# Patient Record
Sex: Male | Born: 1975 | Race: White | Hispanic: No | Marital: Married | Smoking: Never smoker
Health system: Southern US, Community
[De-identification: ages and names within clinical notes are randomized; demographics above are authoritative.]

## PROBLEM LIST (undated history)

## (undated) DIAGNOSIS — G479 Sleep disorder, unspecified: Secondary | ICD-10-CM

## (undated) DIAGNOSIS — F419 Anxiety disorder, unspecified: Secondary | ICD-10-CM

## (undated) HISTORY — PX: APPENDECTOMY: SHX54

## (undated) HISTORY — PX: HERNIA REPAIR: SHX51

---

## 2008-01-18 ENCOUNTER — Ambulatory Visit: Payer: Self-pay | Admitting: Family Medicine

## 2011-02-23 ENCOUNTER — Ambulatory Visit: Payer: Self-pay

## 2011-11-22 ENCOUNTER — Ambulatory Visit: Payer: Self-pay

## 2012-03-25 ENCOUNTER — Ambulatory Visit: Payer: Self-pay | Admitting: Internal Medicine

## 2012-07-01 ENCOUNTER — Ambulatory Visit: Payer: Self-pay | Admitting: Family Medicine

## 2012-09-17 ENCOUNTER — Ambulatory Visit: Payer: Self-pay | Admitting: Family Medicine

## 2013-08-05 ENCOUNTER — Ambulatory Visit: Payer: Self-pay | Admitting: Otolaryngology

## 2013-12-15 ENCOUNTER — Ambulatory Visit: Payer: Self-pay | Admitting: Family Medicine

## 2014-06-13 ENCOUNTER — Ambulatory Visit: Payer: Self-pay | Admitting: Emergency Medicine

## 2014-06-23 ENCOUNTER — Ambulatory Visit: Payer: Self-pay | Admitting: Family Medicine

## 2014-09-25 ENCOUNTER — Ambulatory Visit
Admission: EM | Admit: 2014-09-25 | Discharge: 2014-09-25 | Disposition: A | Discharge status: Home / Self Care | Payer: BLUE CROSS/BLUE SHIELD | Attending: Family Medicine | Admitting: Family Medicine

## 2014-09-25 ENCOUNTER — Encounter: Payer: Self-pay | Admitting: Family Medicine

## 2014-09-25 DIAGNOSIS — J40 Bronchitis, not specified as acute or chronic: Secondary | ICD-10-CM | POA: Diagnosis not present

## 2014-09-25 DIAGNOSIS — J0100 Acute maxillary sinusitis, unspecified: Secondary | ICD-10-CM

## 2014-09-25 DIAGNOSIS — H65191 Other acute nonsuppurative otitis media, right ear: Secondary | ICD-10-CM | POA: Diagnosis not present

## 2014-09-25 MED ORDER — FEXOFENADINE-PSEUDOEPHED ER 180-240 MG PO TB24: 1.0000 | ORAL_TABLET | Freq: Every day | ORAL | Status: DC

## 2014-09-25 MED ORDER — HYDROCOD POLST-CPM POLST ER 10-8 MG/5ML PO SUER: 5.0000 mL | Freq: Two times a day (BID) | ORAL | Status: DC | PRN

## 2014-09-25 MED ORDER — AMOXICILLIN-POT CLAVULANATE 875-125 MG PO TABS: 1.0000 | ORAL_TABLET | Freq: Two times a day (BID) | ORAL | Status: DC

## 2014-09-25 NOTE — ED Provider Notes (Signed)
CSN: [id number]     Arrival date & time 09/25/14  [id number] History   First MD Initiated Contact with Patient 09/25/14 [id number]     Chief Complaint  Patient presents with  . Cough    x 4 days sinus pain/ pressure, right ear pain, sore throat, HA, sinus drainage, runny nose, dry cough but sometimes coughs up grayish phlegm    (Consider location/radiation/quality/duration/timing/severity/associated sxs/prior Treatment) Patient is a 39 y.o. male presenting with cough. The history is provided by the patient. No language interpreter was used.  Cough Cough characteristics:  Productive and barking Sputum characteristics:  Green Severity:  Moderate Duration:  4 days Timing:  Constant Progression:  Worsening Chronicity:  New Context: upper respiratory infection   Relieved by:  Nothing Worsened by:  Lying down Ineffective treatments:  Cough suppressants Associated symptoms: ear fullness, ear pain, myalgias, rhinorrhea, sinus congestion and wheezing   Associated symptoms: no chest pain, no chills, no diaphoresis, no fever, no headaches, no shortness of breath, no sore throat and no weight loss   Ear pain:    Location:  Right   Severity:  Moderate   Duration:  3 days   Timing:  Constant   Progression:  Worsening Rhinorrhea:    Quality:  Green   Severity:  Moderate   Duration:  4 days   Progression:  Worsening   History reviewed. No pertinent past medical history. No past surgical history on file. History reviewed. No pertinent family history. History  Substance Use Topics  . Smoking status: Not on file  . Smokeless tobacco: Not on file  . Alcohol Use: Not on file    Review of Systems  Constitutional: Negative for fever, chills, weight loss and diaphoresis.  HENT: Positive for ear pain and rhinorrhea. Negative for sore throat.   Respiratory: Positive for cough and wheezing. Negative for shortness of breath.   Cardiovascular: Negative for chest pain.  Musculoskeletal: Positive for  myalgias.  Neurological: Negative for headaches.  All other systems reviewed and are negative.   Allergies  Review of patient's allergies indicates no known allergies.  Home Medications   Prior to Admission medications   Medication Sig Start Date End Date Taking? Authorizing Provider  amoxicillin-clavulanate (AUGMENTIN) 875-125 MG per tablet Take 1 tablet by mouth 2 (two) times daily. 09/25/14   Hassan RowanEugene Carisa Backhaus, MD  chlorpheniramine-HYDROcodone [facility](TUSSIONEX PENNKINETIC ER) 10-8 MG/5ML SUER Take 5 mLs by mouth every 12 (twelve) hours as needed for cough. 09/25/14   Hassan RowanEugene Juventino Pavone, MD  fexofenadine-pseudoephedrine (ALLEGRA-D ALLERGY & CONGESTION) 180-240 MG per 24 hr tablet Take 1 tablet by mouth daily. 09/25/14   Hassan RowanEugene Tiburcio Linder, MD   BP 146/90 mmHg  Pulse 70  Temp(Src) 98.2 F (36.8 C) (Tympanic)  Resp 20  Ht 5\' 11"  (1.803 m)  Wt 201 lb (91.173 kg)  BMI 28.05 kg/m2  SpO2 99% Physical Exam  Constitutional: He is oriented to person, place, and time. He appears well-developed and well-nourished.  HENT:  Head: Normocephalic and atraumatic.  Right Ear: External ear and ear canal normal. No swelling or tenderness. Tympanic membrane is erythematous and bulging.  Left Ear: Hearing, tympanic membrane and external ear normal.  Nose: Mucosal edema, rhinorrhea and sinus tenderness present. Right sinus exhibits maxillary sinus tenderness. Left sinus exhibits maxillary sinus tenderness.  Eyes: Pupils are equal, round, and reactive to light.  Neck: Neck supple. No tracheal deviation present.  Cardiovascular: Normal rate.   Pulmonary/Chest: Breath sounds normal. No respiratory distress. He has no wheezes.  Musculoskeletal: Normal  range of motion.  Lymphadenopathy:    He has cervical adenopathy.  Neurological: He is alert and oriented to person, place, and time.  Skin: Skin is warm.    ED Course  Procedures (including critical care time) Labs Review Labs Reviewed - No data to display  Imaging Review No  results found.   MDM   1. Acute maxillary sinusitis, recurrence not specified   2. Bronchitis   3. Other acute nonsuppurative otitis media of right ear        Hassan Rowan, MD 09/25/14 [phone]

## 2014-09-25 NOTE — Discharge Instructions (Signed)
Otitis Media Otitis media is redness, soreness, and puffiness (swelling) in the space just behind your eardrum (middle ear). It may be caused by allergies or infection. It often happens along with a cold. HOME CARE  Take your medicine as told. Finish it even if you start to feel better.  Only take over-the-counter or prescription medicines for pain, discomfort, or fever as told by your doctor.  Follow up with your doctor as told. GET HELP IF:  You have otitis media only in one ear, or bleeding from your nose, or both.  You notice a lump on your neck.  You are not getting better in 3-5 days.  You feel worse instead of better. GET HELP RIGHT AWAY IF:   You have pain that is not helped with medicine.  You have puffiness, redness, or pain around your ear.  You get a stiff neck.  You cannot move part of your face (paralysis).  You notice that the bone behind your ear hurts when you touch it. MAKE SURE YOU:   Understand these instructions.  Will watch your condition.  Will get help right away if you are not doing well or get worse. Document Released: 09/26/2007 Document Revised: 04/14/2013 Document Reviewed: 11/04/2012 [facility] Patient Information 2015 [place], [place]. This information is not intended to replace advice given to you by your health care provider. Make sure you discuss any questions you have with your health care provider.  Sinusitis Sinusitis is redness, soreness, and puffiness (inflammation) of the air pockets in the bones of your face (sinuses). The redness, soreness, and puffiness can cause air and mucus to get trapped in your sinuses. This can allow germs to grow and cause an infection.  HOME CARE   Drink enough fluids to keep your pee (urine) clear or pale yellow.  Use a humidifier in your home.  Run a hot shower to create steam in the bathroom. Sit in the bathroom with the door closed. Breathe in the steam 3-4 times a day.  Put a warm, moist washcloth  on your face 3-4 times a day, or as told by your doctor.  Use salt water sprays (saline sprays) to wet the thick fluid in your nose. This can help the sinuses drain.  Only take medicine as told by your doctor. GET HELP RIGHT AWAY IF:   Your pain gets worse.  You have very bad headaches.  You are sick to your stomach (nauseous).  You throw up (vomit).  You are very sleepy (drowsy) all the time.  Your face is puffy (swollen).  Your vision changes.  You have a stiff neck.  You have trouble breathing. MAKE SURE YOU:   Understand these instructions.  Will watch your condition.  Will get help right away if you are not doing well or get worse. Document Released: 09/26/2007 Document Revised: 01/02/2012 Document Reviewed: 11/13/2011 [facility] Patient Information 2015 [place], [place]. This information is not intended to replace advice given to you by your health care provider. Make sure you discuss any questions you have with your health care provider. Acute Bronchitis Bronchitis is when the airways that extend from the windpipe into the lungs get red, puffy, and painful (inflamed). Bronchitis often causes thick spit (mucus) to develop. This leads to a cough. A cough is the most common symptom of bronchitis. In acute bronchitis, the condition usually begins suddenly and goes away over time (usually in 2 weeks). Smoking, allergies, and asthma can make bronchitis worse. Repeated episodes of bronchitis may cause more  lung problems. HOME CARE  Rest.  Drink enough fluids to keep your pee (urine) clear or pale yellow (unless you need to limit fluids as told by your doctor).  Only take over-the-counter or prescription medicines as told by your doctor.  Avoid smoking and secondhand smoke. These can make bronchitis worse. If you are a smoker, think about using nicotine gum or skin patches. Quitting smoking will help your lungs heal faster.  Reduce the chance of getting bronchitis again  by:  Washing your hands often.  Avoiding people with cold symptoms.  Trying not to touch your hands to your mouth, nose, or eyes.  Follow up with your doctor as told. GET HELP IF: Your symptoms do not improve after 1 week of treatment. Symptoms include:  Cough.  Fever.  Coughing up thick spit.  Body aches.  Chest congestion.  Chills.  Shortness of breath.  Sore throat. GET HELP RIGHT AWAY IF:   You have an increased fever.  You have chills.  You have severe shortness of breath.  You have bloody thick spit (sputum).  You throw up (vomit) often.  You lose too much body fluid (dehydration).  You have a severe headache.  You faint. MAKE SURE YOU:   Understand these instructions.  Will watch your condition.  Will get help right away if you are not doing well or get worse. Document Released: 09/26/2007 Document Revised: 12/10/2012 Document Reviewed: 09/30/2012 [facility] Patient Information 2015 [place]. This information is not intended to replace advice given to you by your health care provider. Make sure you discuss any questions you have with your health care provider.

## 2015-04-28 ENCOUNTER — Ambulatory Visit
Admission: EM | Admit: 2015-04-28 | Discharge: 2015-04-28 | Discharge status: Absent without leave | Payer: BLUE CROSS/BLUE SHIELD

## 2015-04-29 ENCOUNTER — Ambulatory Visit
Admission: EM | Admit: 2015-04-29 | Discharge: 2015-04-29 | Disposition: A | Discharge status: Home / Self Care | Payer: BLUE CROSS/BLUE SHIELD | Attending: Family Medicine | Admitting: Family Medicine

## 2015-04-29 ENCOUNTER — Ambulatory Visit (INDEPENDENT_AMBULATORY_CARE_PROVIDER_SITE_OTHER): Payer: BLUE CROSS/BLUE SHIELD

## 2015-04-29 ENCOUNTER — Encounter: Payer: Self-pay | Admitting: Emergency Medicine

## 2015-04-29 DIAGNOSIS — J40 Bronchitis, not specified as acute or chronic: Secondary | ICD-10-CM

## 2015-04-29 DIAGNOSIS — J0100 Acute maxillary sinusitis, unspecified: Secondary | ICD-10-CM

## 2015-04-29 MED ORDER — IPRATROPIUM-ALBUTEROL 0.5-2.5 (3) MG/3ML IN SOLN
3.0000 mL | Freq: Four times a day (QID) | RESPIRATORY_TRACT | Status: DC
  Administered 2015-04-29: 3 mL via RESPIRATORY_TRACT

## 2015-04-29 MED ORDER — GUAIFENESIN-CODEINE 100-10 MG/5ML PO SOLN: 10.0000 mL | Freq: Three times a day (TID) | ORAL | Status: DC | PRN

## 2015-04-29 MED ORDER — PREDNISONE 10 MG PO TABS: ORAL_TABLET | ORAL | Status: DC

## 2015-04-29 MED ORDER — ALBUTEROL SULFATE HFA 108 (90 BASE) MCG/ACT IN AERS: 2.0000 | INHALATION_SPRAY | RESPIRATORY_TRACT | Status: DC | PRN

## 2015-04-29 MED ORDER — AMOXICILLIN-POT CLAVULANATE 875-125 MG PO TABS
1.0000 | ORAL_TABLET | Freq: Two times a day (BID) | ORAL | Status: DC
Start: 2015-04-29 — End: 2017-06-15

## 2015-04-29 NOTE — ED Provider Notes (Signed)
[facility]  ____________________________________________  Time seen: Approximately 8:58 AM  I have reviewed the triage vital signs and the nursing notes.   HISTORY  Chief Complaint Cough   HPI Roy Castro is a 40 y.o. male presents for the complaints of 1.5-2 weeks of runny nose, nasal congestion, sinus pressure, sinus drainage as well as cough. Reports the last few days with the cough has increased with intermittent wheezing. Denies chest pain or shortness of breath. Reports continues to eat and drink well. Reports cough recently keeps him up at night. Reports symptoms have been unresolved with over-the-counter cough and congestion medications. States current sinus discomfort is 3 out of 10 aching and pressure around nasal and cheek bones.  Reports multiple sick contacts at work and at home.  Denies chest pain, shortness breath, abdominal pain, dizziness, weakness or fevers.   No past medical history on file.  There are no active problems to display for this patient.   Past Surgical History  Procedure Laterality Date  . Appendectomy    . Hernia repair      Current Outpatient Rx  Name  Route  Sig  Dispense  Refill  .           .           .             Allergies Review of patient's allergies indicates no known allergies.  No family history on file.  Social History Social History  Substance Use Topics  . Smoking status: Never Smoker   . Smokeless tobacco: Not on file  . Alcohol Use: Yes    Review of Systems Constitutional: No fever/chills Eyes: No visual changes. ENT: No sore throat. Positive runny nose, nasal congestion, nasal drainage and cough. Cardiovascular: Denies chest pain. Respiratory: Denies shortness of breath. Gastrointestinal: No abdominal pain.  No nausea, no vomiting.  No diarrhea.  No constipation. Genitourinary: Negative for dysuria. Musculoskeletal: Negative for back pain. Skin: Negative for rash. Neurological: Negative for  headaches, focal weakness or numbness.  10-point ROS otherwise negative.  ____________________________________________   PHYSICAL EXAM:  VITAL SIGNS: ED Triage Vitals  Enc Vitals Group     BP 04/29/15 0813 147/91 mmHg     Pulse Rate 04/29/15 0813 77     Resp 04/29/15 0813 16     Temp 04/29/15 0813 97 F (36.1 C)     Temp Source 04/29/15 0813 Tympanic     SpO2 04/29/15 0813 98 %     Weight 04/29/15 0813 195 lb (88.451 kg)     Height 04/29/15 0813 5\' 11"  (1.803 m)     Head Cir --      Peak Flow --      Pain Score 04/29/15 0816 7     Pain Loc --      Pain Edu? --      Excl. in GC? --     Constitutional: Alert and oriented. Well appearing and in no acute distress. Eyes: Conjunctivae are normal. PERRL. EOMI. Head: Atraumatic. Mild tenderness to palpation bilateral frontal maxillary sinuses. No swelling. No erythema.  Ears: no erythema, normal TMs bilaterally.   Nose: Nasal congestion with bilateral nasal turbinate erythema.  Mouth/Throat: Mucous membranes are moist.  Oropharynx non-erythematous. Neck: No stridor.  No cervical spine tenderness to palpation. Hematological/Lymphatic/Immunilogical: No cervical lymphadenopathy. Cardiovascular: Normal rate, regular rhythm. Grossly normal heart sounds.  Good peripheral circulation. Respiratory: Normal respiratory effort.  No retractions. Mild scattered rhonchi. Intermittent scattered inspiratory  wheeze. No rales. Good air movement. Dry intermittent cough. Speaks in complete sentences. Gastrointestinal: Soft and nontender.  Musculoskeletal: No lower or upper extremity tenderness nor edema.   Bilateral pedal pulses equal and easily palpated.  Neurologic:  Normal speech and language. No gross focal neurologic deficits are appreciated. No gait instability. Skin:  Skin is warm, dry and intact. No rash noted. Psychiatric: Mood and affect are normal. Speech and behavior are normal.  ____________________________________________    LABS (all labs ordered are listed, but only abnormal results are displayed)  Labs Reviewed - No data to display  RADIOLOGY  EXAM: CHEST 2 VIEW  COMPARISON: Prior chest x-ray 06/13/2014  FINDINGS: The lungs are clear and negative for focal airspace consolidation, pulmonary edema or suspicious pulmonary nodule. No pleural effusion or pneumothorax. Cardiac and mediastinal contours are within normal limits. No acute fracture or lytic or blastic osseous lesions. The visualized upper abdominal bowel gas pattern is unremarkable.  IMPRESSION: Negative chest x-ray.   Electronically Signed By: Malachy MoanHeath McCullough M.D. On: 04/29/2015 08:50  I, Renford DillsLindsey Ival Pacer, personally viewed and evaluated these images (plain radiographs) as part of my medical decision making, as well as reviewing the written report by the radiologist.  ____________________________________________  INITIAL IMPRESSION / ASSESSMENT AND PLAN / ED COURSE  Pertinent labs & imaging results that were available during my care of the patient were reviewed by me and considered in my medical decision making (see chart for details).  Very well-appearing patient. No acute distress. Presents for the complaints of 1.5-2 weeks of runny nose, nasal congestion, sinus drainage, sinus pressure and cough. Reports the last few days increased cough with intermittent wheezing. Scattered rhonchi and wheezes on exam. Will evaluate chest x-ray. Albuterol ipratropium neb 1.  Post neb, patient reports feeling better and wheezes fully resolved. Chest x-ray negative per radiologist. Will treat bronchitis and sinusitis with oral Augmentin, prednisone taper, when necessary albuterol inhaler as well as when necessary guaifenesin with codeine. Encouraged rest, fluids, PCP follow up. Work note given for today and tomorrow.  Discussed follow up with Primary care physician this week. Discussed follow up and return parameters including no resolution or  any worsening concerns. Patient verbalized understanding and agreed to plan.   ____________________________________________   FINAL CLINICAL IMPRESSION(S) / ED DIAGNOSES  Final diagnoses:  Bronchitis  Acute maxillary sinusitis, recurrence not specified       Renford DillsLindsey Arjay Jaskiewicz, NP 04/29/15 [phone]

## 2015-04-29 NOTE — Discharge Instructions (Signed)
Take medication as prescribed. Rest. Eat and drink regularly.   Follow up with your primary care physician this week as needed. Return to Urgent care for new or worsening concerns.    Sinusitis, Adult Sinusitis is redness, soreness, and puffiness (inflammation) of the air pockets in the bones of your face (sinuses). The redness, soreness, and puffiness can cause air and mucus to get trapped in your sinuses. This can allow germs to grow and cause an infection.  HOME CARE   Drink enough fluids to keep your pee (urine) clear or pale yellow.  Use a humidifier in your home.  Run a hot shower to create steam in the bathroom. Sit in the bathroom with the door closed. Breathe in the steam 3-4 times a day.  Put a warm, moist washcloth on your face 3-4 times a day, or as told by your doctor.  Use salt water sprays (saline sprays) to wet the thick fluid in your nose. This can help the sinuses drain.  Only take medicine as told by your doctor. GET HELP RIGHT AWAY IF:   Your pain gets worse.  You have very bad headaches.  You are sick to your stomach (nauseous).  You throw up (vomit).  You are very sleepy (drowsy) all the time.  Your face is puffy (swollen).  Your vision changes.  You have a stiff neck.  You have trouble breathing. MAKE SURE YOU:   Understand these instructions.  Will watch your condition.  Will get help right away if you are not doing well or get worse.   This information is not intended to replace advice given to you by your health care provider. Make sure you discuss any questions you have with your health care provider.   Document Released: 09/26/2007 Document Revised: 04/30/2014 Document Reviewed: 11/13/2011 Elsevier Interactive Patient Education [employer]

## 2015-04-29 NOTE — ED Notes (Signed)
Patient c/o cough and chest congestion, HAs, and sinus pressure for 2 weeks.  Patient denies fevers.

## 2017-05-30 IMAGING — CR DG CHEST 2V
2 series · 2 of 2 positions shown · non-contrast
Comparison: Prior chest x-ray 06/13/2014

CLINICAL DATA: 39-year-old male with chest congestion, chest pain
tightness and nonproductive cough

EXAM:
CHEST  2 VIEW

[chest pa]
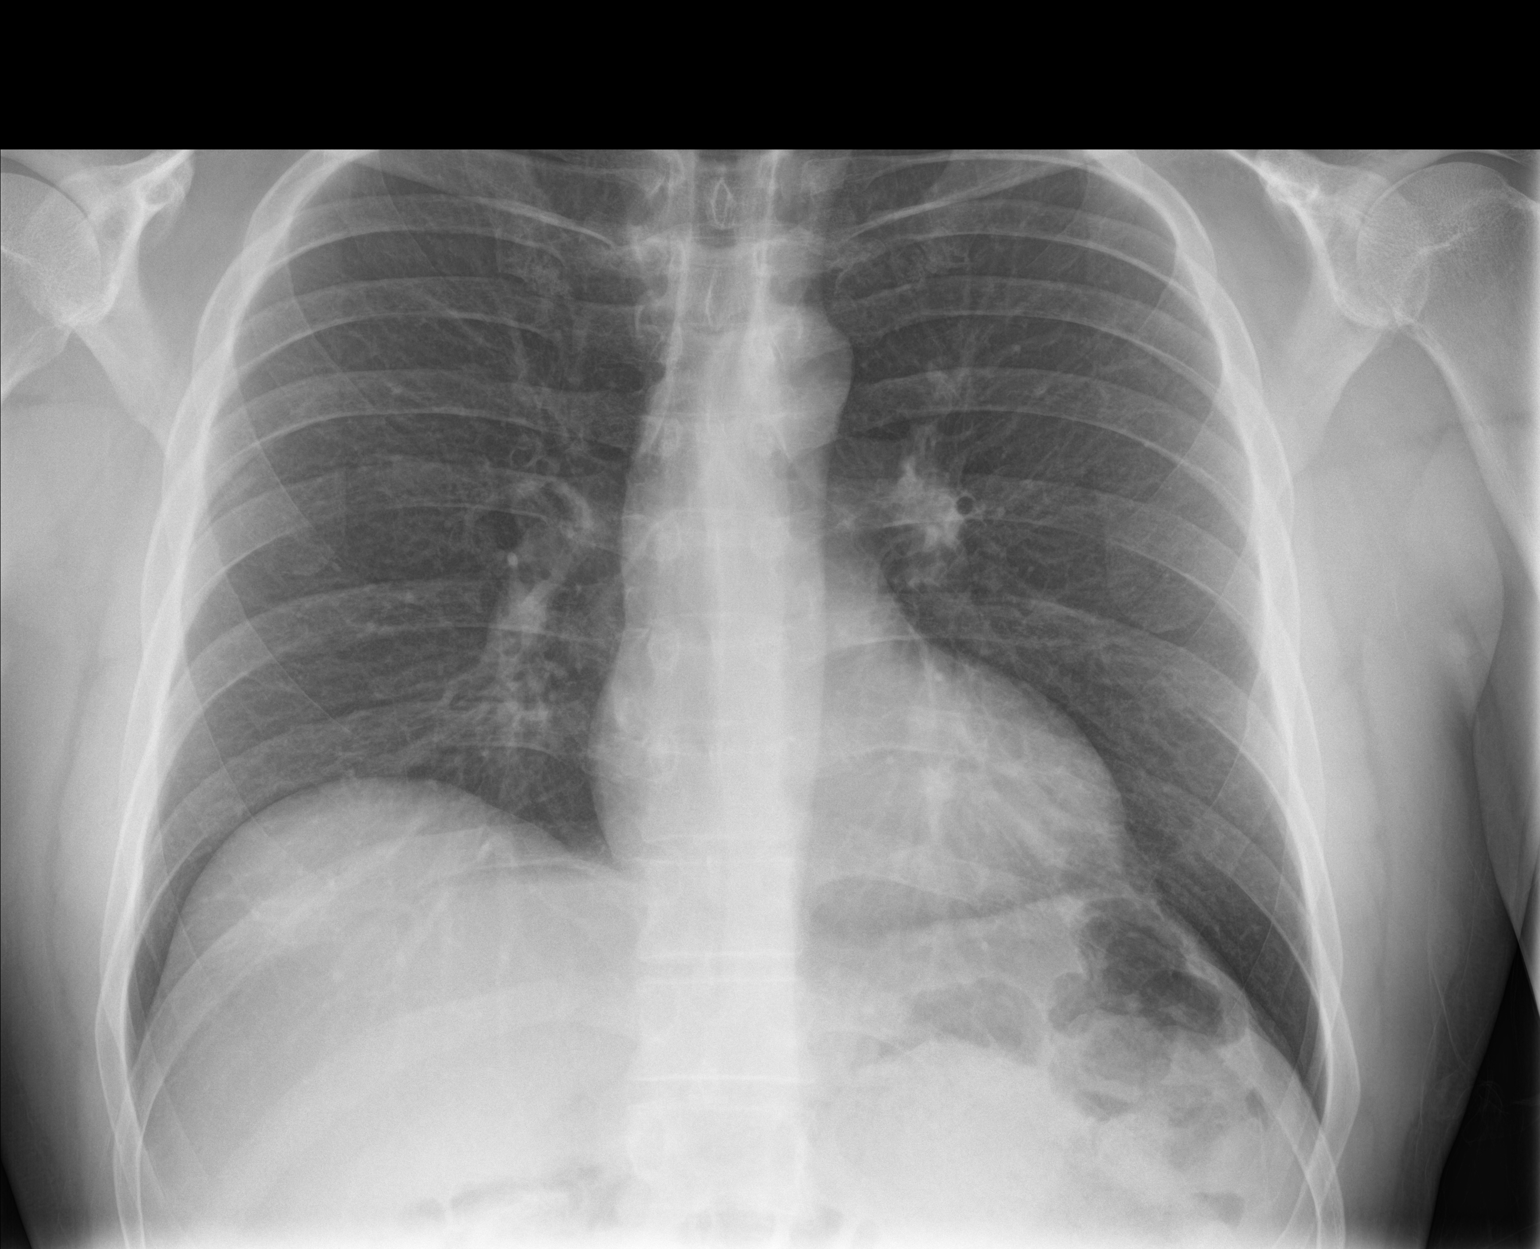

[chest lat]
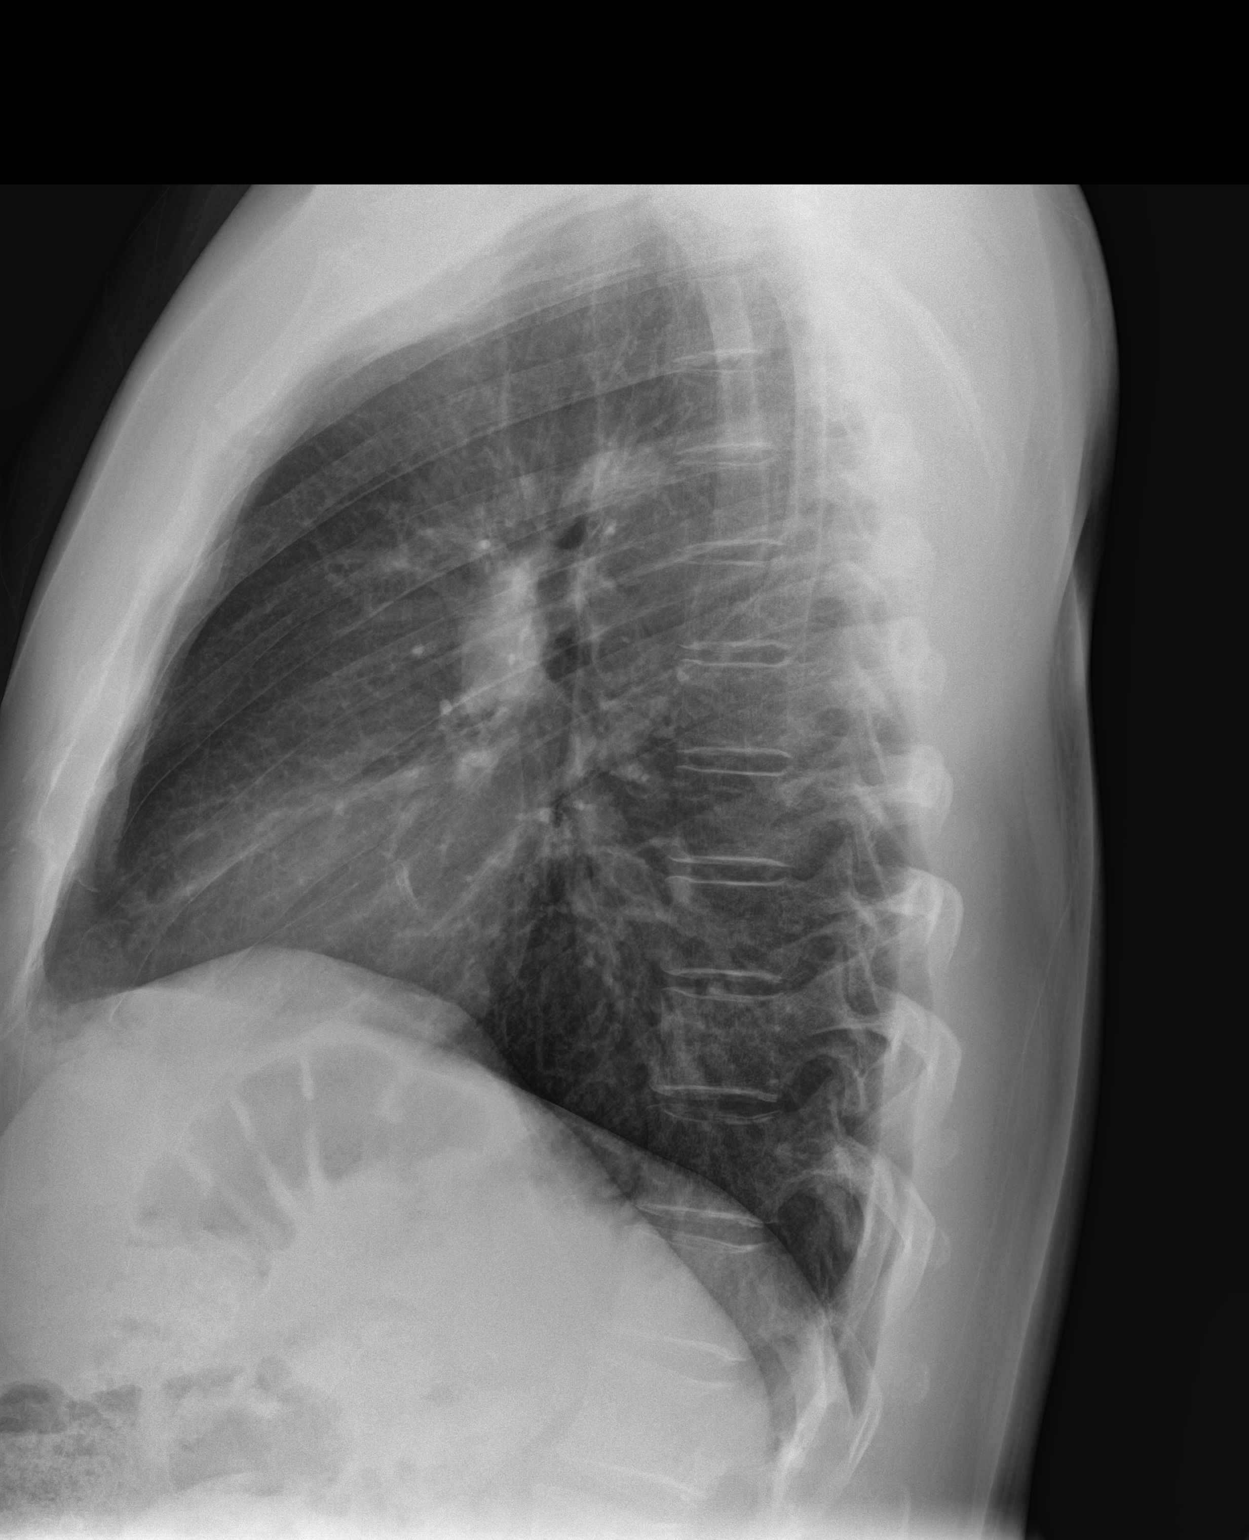

[2 of 2 positions shown; findings below may reference images not displayed]

FINDINGS: The lungs are clear and negative for focal airspace consolidation,
pulmonary edema or suspicious pulmonary nodule. No pleural effusion
or pneumothorax. Cardiac and mediastinal contours are within normal
limits. No acute fracture or lytic or blastic osseous lesions. The
visualized upper abdominal bowel gas pattern is unremarkable.
IMPRESSION: Negative chest x-ray.

## 2017-06-15 ENCOUNTER — Encounter: Payer: Self-pay | Admitting: Gynecology

## 2017-06-15 ENCOUNTER — Ambulatory Visit
Admission: EM | Admit: 2017-06-15 | Discharge: 2017-06-15 | Disposition: A | Discharge status: Home / Self Care | Payer: BLUE CROSS/BLUE SHIELD

## 2017-06-15 ENCOUNTER — Other Ambulatory Visit: Payer: Self-pay

## 2017-06-15 DIAGNOSIS — J069 Acute upper respiratory infection, unspecified: Secondary | ICD-10-CM

## 2017-06-15 DIAGNOSIS — J020 Streptococcal pharyngitis: Secondary | ICD-10-CM

## 2017-06-15 DIAGNOSIS — J320 Chronic maxillary sinusitis: Secondary | ICD-10-CM

## 2017-06-15 DIAGNOSIS — J0100 Acute maxillary sinusitis, unspecified: Secondary | ICD-10-CM | POA: Diagnosis not present

## 2017-06-15 HISTORY — DX: Anxiety disorder, unspecified: F41.9

## 2017-06-15 HISTORY — DX: Sleep disorder, unspecified: G47.9

## 2017-06-15 LAB — RAPID STREP SCREEN (MED CTR MEBANE ONLY): Streptococcus, Group A Screen (Direct): POSITIVE — AB

## 2017-06-15 MED ORDER — CETIRIZINE HCL 10 MG PO TABS: 10.0000 mg | ORAL_TABLET | Freq: Every day | ORAL | 1 refills | Status: DC

## 2017-06-15 MED ORDER — AMOXICILLIN-POT CLAVULANATE 875-125 MG PO TABS: 1.0000 | ORAL_TABLET | Freq: Two times a day (BID) | ORAL | 0 refills | Status: DC

## 2017-06-15 NOTE — ED Provider Notes (Signed)
[facility]    CSN: [id number] Arrival date & time: 06/15/17  [id number]     History   Chief Complaint Chief Complaint  Patient presents with  . Cough    HPI Georges LynchJoshua C Pilgrim is a 42 y.o. male.   Patient is a 42 year old male who presents complaining of head and chest cold symptoms for about 3 weeks.  Patient states he had increased pain to the right of the neck and jaw as well as increasing sore throat pain yesterday and this morning.  Patient reports some possible patches on his throat.  Patient does report some coworkers with strep throat, bronchitis and walking pneumonia.  Patient reports fever and chills about a week ago but none recently.  He does report a cough with production of some white mucus.  Patient does report a runny nose as well.  Patient reports some left ear pain is gotten better but states pain to his right ear has gotten worse with increasing pain over the last day or so.  Patient denies chest pain or shortness of breath.  Patient denies abdominal pain but does report some nausea.  Patient states he was taking some Mucinex but did not feel it was doing things that he quit taking it.      Past Medical History:  Diagnosis Date  . Anxiety   . Sleeping difficulty     There are no active problems to display for this patient.   Past Surgical History:  Procedure Laterality Date  . APPENDECTOMY    . HERNIA REPAIR         Home Medications    Prior to Admission medications   Medication Sig Start Date End Date Taking? Authorizing Provider  sertraline (ZOLOFT) 25 MG tablet Take 25 mg by mouth daily.   Yes [provider]  amoxicillin-clavulanate (AUGMENTIN) 875-125 MG tablet Take 1 tablet by mouth every 12 (twelve) hours. 06/15/17   Candis SchatzHarris, Gennell How D, PA-C  cetirizine (ZYRTEC) 10 MG tablet Take 1 tablet (10 mg total) by mouth daily. 06/15/17   Candis SchatzHarris, Adorian Gwynne D, PA-C    Family History History reviewed. No pertinent family history.  Social  History Social History   Tobacco Use  . Smoking status: Never Smoker  . Smokeless tobacco: Never Used  Substance Use Topics  . Alcohol use: Yes  . Drug use: No     Allergies   Patient has no known allergies.   Review of Systems Review of Systems  As noted above in HPI.  Other systems reviewed and found to be negative   Physical Exam Triage Vital Signs ED Triage Vitals  Enc Vitals Group     BP 06/15/17 1002 (!) 148/92     Pulse Rate 06/15/17 1002 72     Resp --      Temp 06/15/17 1002 99.2 F (37.3 C)     Temp Source 06/15/17 1002 Oral     SpO2 06/15/17 1002 98 %     Weight 06/15/17 0959 195 lb (88.5 kg)     Height 06/15/17 0959 5\' 11"  (1.803 m)     Head Circumference --      Peak Flow --      Pain Score 06/15/17 0959 6     Pain Loc --      Pain Edu? --      Excl. in GC? --    No data found.  Updated Vital Signs BP (!) 148/92 (BP Location: Left Arm)   Pulse 72  Temp 99.2 F (37.3 C) (Oral)   Ht 5\' 11"  (1.803 m)   Wt 195 lb (88.5 kg)   SpO2 98%   BMI 27.20 kg/m   Physical Exam  Constitutional: He appears well-developed and well-nourished. No distress.  HENT:  Head: Normocephalic and atraumatic.  Right Ear: There is mastoid tenderness. Tympanic membrane is not injected and not erythematous. A middle ear effusion is present.  Left Ear: No mastoid tenderness. Tympanic membrane is not injected and not erythematous. A middle ear effusion is present.  Nose: Right sinus exhibits maxillary sinus tenderness. Right sinus exhibits no frontal sinus tenderness. Left sinus exhibits maxillary sinus tenderness. Left sinus exhibits no frontal sinus tenderness.  Mouth/Throat: Uvula is midline and mucous membranes are normal. Posterior oropharyngeal erythema (mild) present. Tonsils are 1+ on the right. Tonsils are 1+ on the left. Tonsillar exudate (couple of small patches).  Clear post nasal drainage  Eyes: EOM are normal. Pupils are equal, round, and reactive to light.    Cardiovascular: Normal rate, regular rhythm and normal heart sounds. Exam reveals no friction rub.  No murmur heard. Pulmonary/Chest: Effort normal and breath sounds normal. No respiratory distress. He has no wheezes.  No wheezing or cough on forced expiration  Abdominal: Soft. Bowel sounds are normal. There is no tenderness. There is no guarding.  Musculoskeletal: Normal range of motion.  Neurological: He is alert. No cranial nerve deficit.  Skin: Skin is warm and dry.  Psychiatric: He has a normal mood and affect. His behavior is normal.     UC Treatments / Results  Labs (all labs ordered are listed, but only abnormal results are displayed) Labs Reviewed  RAPID STREP SCREEN (NOT AT [facility]) - Abnormal; Notable for the following components:      Result Value   Streptococcus, Group A Screen (Direct) POSITIVE (*)    All other components within normal limits    EKG  EKG Interpretation None       Radiology No results found.  Procedures Procedures (including critical care time)  Medications Ordered in UC Medications - No data to display   Initial Impression / Assessment and Plan / UC Course  I have reviewed the triage vital signs and the nursing notes.  Pertinent labs & imaging results that were available during my care of the patient were reviewed by me and considered in my medical decision making (see chart for details).     Patient with headache/chest cold for 3 weeks but with increase of right jaw and throat pain yesterday.  Sick contacts at work including strep and bronchitis.  Rapid strep positive  Final Clinical Impressions(s) / UC Diagnoses   Final diagnoses:  Upper respiratory tract infection, unspecified type  Maxillary sinusitis, unspecified chronicity  Streptococcal sore throat    ED Discharge Orders        Ordered    cetirizine (ZYRTEC) 10 MG tablet  Daily     06/15/17 1031    amoxicillin-clavulanate (AUGMENTIN) 875-125 MG tablet  Every 12 hours      06/15/17 1031     Give patient prescription for Augmentin as well as for Zyrtec.  Recommend ibuprofen and Tylenol for pain.  Sinus rinse instructions given.  Follow-up with primary care provider as needed  Controlled Substance Prescriptions [place] Controlled Substance Registry consulted? Not Applicable   Candis Schatz, PA-C 06/15/17 1042

## 2017-06-15 NOTE — Discharge Instructions (Signed)
-  Augmentin: 1 tablet twice daily for 7 days -Zyrtec: 1 tablet daily to help open up nasal passages and tears drain -Ibuprofen or Tylenol for pain -Sinus rinse using nasal saline spray or Nettie pot as attached -Follow-up with primary care provider as needed.

## 2017-06-15 NOTE — ED Triage Notes (Signed)
Patient c/o cough / chest and head congestion x 3 weeks.

## 2017-06-18 ENCOUNTER — Telehealth: Payer: Self-pay

## 2017-06-18 NOTE — Telephone Encounter (Signed)
I called pt for f/u call. He is feeling much better and will f/u as needed

## 2021-09-27 ENCOUNTER — Ambulatory Visit
Admission: EM | Admit: 2021-09-27 | Discharge: 2021-09-27 | Disposition: A | Discharge status: Home / Self Care | Payer: BC Managed Care – PPO

## 2021-09-27 DIAGNOSIS — W57XXXA Bitten or stung by nonvenomous insect and other nonvenomous arthropods, initial encounter: Secondary | ICD-10-CM

## 2021-09-27 DIAGNOSIS — L03115 Cellulitis of right lower limb: Secondary | ICD-10-CM | POA: Diagnosis not present

## 2021-09-27 DIAGNOSIS — S80861A Insect bite (nonvenomous), right lower leg, initial encounter: Secondary | ICD-10-CM | POA: Diagnosis not present

## 2021-09-27 MED ORDER — DOXYCYCLINE HYCLATE 100 MG PO CAPS: 100.0000 mg | ORAL_CAPSULE | Freq: Two times a day (BID) | ORAL | 0 refills | Status: AC

## 2021-09-27 NOTE — ED Provider Notes (Signed)
[facility]    CSN: [id number] Arrival date & time: 09/27/21  0802      History   Chief Complaint No chief complaint on file.   HPI Roy Castro is a 46 y.o. male.   HPI  46 year old male here for evaluation of an insect bite on his right leg.  Patient reports that his wife noticed a bite on his right lower leg sometime last week.  He thinks it might of been Thursday or Friday.  He is unsure of when he got bit or what bit him.  He also has another smaller insect bite on the inner aspect of his right ankle and on the back inner left ankle.  All the lesions are scabbed.  1 of concern, on the outside of his right lower leg, has developed redness around the bite site.  Patient circled the redness and noticed that it continued to grow so he came in for evaluation.  He denies any fever, drainage, swelling of his leg, or numbness or tingling of his foot.  Past Medical History:  Diagnosis Date   Anxiety    Sleeping difficulty     There are no problems to display for this patient.   Past Surgical History:  Procedure Laterality Date   APPENDECTOMY     HERNIA REPAIR         Home Medications    Prior to Admission medications   Medication Sig Start Date End Date Taking? Authorizing Provider  amphetamine-dextroamphetamine (ADDERALL) 20 MG tablet Take 20 mg by mouth 2 (two) times daily. 08/31/21  Yes [provider]  buPROPion (WELLBUTRIN XL) 150 MG 24 hr tablet Take 150 mg by mouth every morning. 08/26/21  Yes [provider]  doxycycline (VIBRAMYCIN) 100 MG capsule Take 1 capsule (100 mg total) by mouth 2 (two) times daily. 09/27/21  Yes Becky Augusta, NP    Family History History reviewed. No pertinent family history.  Social History Social History   Tobacco Use   Smoking status: Never   Smokeless tobacco: Never  Substance Use Topics   Alcohol use: Yes   Drug use: No     Allergies   Patient has no known allergies.   Review of  Systems Review of Systems  Constitutional:  Negative for fever.  Skin:  Positive for color change and wound.  Neurological:  Negative for weakness and numbness.  Hematological: Negative.   Psychiatric/Behavioral: Negative.      Physical Exam Triage Vital Signs ED Triage Vitals  Enc Vitals Group     BP 09/27/21 0810 (!) 138/95     Pulse Rate 09/27/21 0810 76     Resp --      Temp 09/27/21 0810 98.2 F (36.8 C)     Temp Source 09/27/21 0810 Oral     SpO2 09/27/21 0810 100 %     Weight 09/27/21 0808 200 lb (90.7 kg)     Height 09/27/21 0808 5\' 11"  (1.803 m)     Head Circumference --      Peak Flow --      Pain Score 09/27/21 0808 0     Pain Loc --      Pain Edu? --      Excl. in GC? --    No data found.  Updated Vital Signs BP (!) 138/95 (BP Location: Left Arm)   Pulse 76   Temp 98.2 F (36.8 C) (Oral)   Ht 5\' 11"  (1.803 m)   Wt 200 lb (  90.7 kg)   SpO2 100%   BMI 27.89 kg/m   Visual Acuity Right Eye Distance:   Left Eye Distance:   Bilateral Distance:    Right Eye Near:   Left Eye Near:    Bilateral Near:     Physical Exam Vitals and nursing note reviewed.  Constitutional:      Appearance: Normal appearance. He is not ill-appearing.  HENT:     Head: Normocephalic and atraumatic.  Musculoskeletal:        General: No swelling, tenderness, deformity or signs of injury. Normal range of motion.  Skin:    General: Skin is warm and dry.     Capillary Refill: Capillary refill takes less than 2 seconds.     Findings: Erythema present.  Neurological:     General: No focal deficit present.     Mental Status: He is alert and oriented to person, place, and time.  Psychiatric:        Mood and Affect: Mood normal.        Behavior: Behavior normal.        Thought Content: Thought content normal.        Judgment: Judgment normal.     UC Treatments / Results  Labs (all labs ordered are listed, but only abnormal results are displayed) Labs Reviewed - No data to  display  EKG   Radiology No results found.  Procedures Procedures (including critical care time)  Medications Ordered in UC Medications - No data to display  Initial Impression / Assessment and Plan / UC Course  I have reviewed the triage vital signs and the nursing notes.  Pertinent labs & imaging results that were available during my care of the patient were reviewed by me and considered in my medical decision making (see chart for details).  Patient is a very pleasant, nontoxic-appearing 6 old male here for evaluation of a possible infected insect bite on the outside of his right lower leg that is been there for 5 to 6 days.  It is not draining or hot.  Patient has not had a fever.  He did circled the initial area of redness and noticed that the redness extended beyond that circle so he came in for evaluation.  On exam patient does have a macular area of erythema that is circular in form and approximately the size of a silver dollar.  There is a scabbed wound in the middle of the lesion.  The lesion is not raised, hot, or tender to touch.  Patient's DP and PT pulses in his right foot are 2+ and there is no swelling to the right lower extremity.  Given that patient has extension of redness I will treat him for cellulitis with doxycycline twice daily for 10 days.  This will cover any potential tickborne illnesses as well as cover for MRSA.  Return and ER precautions reviewed with patient.  He denies need for work note.   Final Clinical Impressions(s) / UC Diagnoses   Final diagnoses:  Cellulitis of right lower extremity  Insect bite of right lower leg, initial encounter     Discharge Instructions      Take the Doxycycline twice daily with food for 10 days.  Doxycycline will make you more sensitive to sunburn so wear sunscreen when outdoors and reapply it every 90 minutes.  Apply warm compresses to help promote drainage.  Use OTC Tylenol and Ibuprofen according to the package  instructions as needed for pain.  Return for new  or worsening symptoms.       ED Prescriptions     Medication Sig Dispense Auth. Provider   doxycycline (VIBRAMYCIN) 100 MG capsule Take 1 capsule (100 mg total) by mouth 2 (two) times daily. 20 capsule Becky Augustayan, Dlisa Barnwell, NP      PDMP not reviewed this encounter.   Becky Augustayan, Hillman Attig, NP 09/27/21 [phone]

## 2021-09-27 NOTE — ED Triage Notes (Signed)
Patient presents to UC for a insect bite on his right leg.  He drew a circle around it and the redness is exceeding the circle.  He was bite at some point between Thursday and Friday.

## 2021-09-27 NOTE — Discharge Instructions (Signed)
Take the Doxycycline twice daily with food for 10 days.  Doxycycline will make you more sensitive to sunburn so wear sunscreen when outdoors and reapply it every 90 minutes.  Apply warm compresses to help promote drainage.  Use OTC Tylenol and Ibuprofen according to the package instructions as needed for pain.  Return for new or worsening symptoms.   

## 2022-04-17 ENCOUNTER — Ambulatory Visit: Admit: 2022-04-17 | Payer: Self-pay

## 2023-10-02 ENCOUNTER — Ambulatory Visit
Admission: EM | Admit: 2023-10-02 | Discharge: 2023-10-02 | Disposition: A | Discharge status: Home / Self Care | Attending: Family Medicine | Admitting: Family Medicine

## 2023-10-02 DIAGNOSIS — H6502 Acute serous otitis media, left ear: Secondary | ICD-10-CM

## 2023-10-02 MED ORDER — PREDNISONE 10 MG (21) PO TBPK: ORAL_TABLET | Freq: Every day | ORAL | 0 refills | Status: AC

## 2023-10-02 MED ORDER — AMOXICILLIN-POT CLAVULANATE 875-125 MG PO TABS: 1.0000 | ORAL_TABLET | Freq: Two times a day (BID) | ORAL | 0 refills | Status: AC

## 2023-10-02 NOTE — ED Triage Notes (Signed)
 Left ear pain started yesterday. Left jaw now hurts

## 2023-10-02 NOTE — ED Provider Notes (Signed)
 [facility]    CSN: [id number] Arrival date & time: 10/02/23  1707      History   Chief Complaint Chief Complaint  Patient presents with   Otalgia    HPI Roy Castro is a 48 y.o. male.   HPI   Roy Castro presents for left ear pain/pressure  with associated  muffled hearing and left jaw pain that started yesterday. He used a heating pad but that didn't help. He was not able to sleep. No fever, cough, nasal congestion or rhinorrhea.  He does use Q-tips.         Past Medical History:  Diagnosis Date   Anxiety    Sleeping difficulty     There are no active problems to display for this patient.   Past Surgical History:  Procedure Laterality Date   APPENDECTOMY     HERNIA REPAIR         Home Medications    Prior to Admission medications   Medication Sig Start Date End Date Taking? Authorizing Provider  amoxicillin -clavulanate (AUGMENTIN ) 875-125 MG tablet Take 1 tablet by mouth every 12 (twelve) hours. 10/02/23  Yes Ysabela Keisler, DO  amphetamine-dextroamphetamine (ADDERALL) 20 MG tablet Take 20 mg by mouth 2 (two) times daily. 08/31/21  Yes [provider]  buPROPion (WELLBUTRIN XL) 150 MG 24 hr tablet Take 150 mg by mouth every morning. 08/26/21  Yes [provider]  predniSONE  (STERAPRED UNI-PAK 21 TAB) 10 MG (21) TBPK tablet Take by mouth daily. Take 6 tabs by mouth daily for 1, then 5 tabs for 1 day, then 4 tabs for 1 day, then 3 tabs for 1 day, then 2 tabs for 1 day, then 1 tab for 1 day. 10/02/23  Yes Burleigh Brockmann, DO  doxycycline  (VIBRAMYCIN ) 100 MG capsule Take 1 capsule (100 mg total) by mouth 2 (two) times daily. 09/27/21   Kent Pear, NP    Family History History reviewed. No pertinent family history.  Social History Social History   Tobacco Use   Smoking status: Never   Smokeless tobacco: Never  Substance Use Topics   Alcohol use: Yes   Drug use: No     Allergies   Hydrocodone-acetaminophen   Review of  Systems Review of Systems: :negative unless otherwise stated in HPI.      Physical Exam Triage Vital Signs ED Triage Vitals [10/02/23 1724]  Encounter Vitals Group     BP 126/85     Systolic BP Percentile      Diastolic BP Percentile      Pulse Rate 92     Resp 16     Temp 98.7 F (37.1 C)     Temp Source Oral     SpO2 95 %     Weight      Height      Head Circumference      Peak Flow      Pain Score 7     Pain Loc      Pain Education      Exclude from Growth Chart    No data found.  Updated Vital Signs BP 126/85 (BP Location: Right Arm)   Pulse 92   Temp 98.7 F (37.1 C) (Oral)   Resp 16   SpO2 95%   Visual Acuity Right Eye Distance:   Left Eye Distance:   Bilateral Distance:    Right Eye Near:   Left Eye Near:    Bilateral Near:     Physical Exam GEN:  alert, non-toxic appearing male in no distress    HENT:  mucus membranes moist, no nasal discharge, right TM normal, left TM erythematous with effusion, normal external auditory canals bilaterally, nontender tragus EYES:   no scleral injection NECK:  normal ROM, no lymphadenopathy, no meningismus   RESP:  no increased work of breathing Skin:   warm and dry    UC Treatments / Results  Labs (all labs ordered are listed, but only abnormal results are displayed) Labs Reviewed - No data to display  EKG   Radiology No results found.  Procedures Procedures (including critical care time)  Medications Ordered in UC Medications - No data to display  Initial Impression / Assessment and Plan / UC Course  I have reviewed the triage vital signs and the nursing notes.  Pertinent labs & imaging results that were available during my care of the patient were reviewed by me and considered in my medical decision making (see chart for details).     Acute Otitis media Overall patient is well-appearing, well-hydrated and without respiratory distress. He is afebrile. Treat otitis media with effusion with  with Augmentin  and prednisone . days.  Tylenol/Motrin's as needed for fever or discomfort.    Discussed MDM, treatment plan and plan for follow-up with patient who agrees with plan.    Final Clinical Impressions(s) / UC Diagnoses   Final diagnoses:  Non-recurrent acute serous otitis media of left ear     Discharge Instructions      Stop by the pharmacy to pick up your prescriptions.  Follow up with your primary care provider or return to the urgent care, if not improving.      ED Prescriptions     Medication Sig Dispense Auth. Provider   predniSONE  (STERAPRED UNI-PAK 21 TAB) 10 MG (21) TBPK tablet Take by mouth daily. Take 6 tabs by mouth daily for 1, then 5 tabs for 1 day, then 4 tabs for 1 day, then 3 tabs for 1 day, then 2 tabs for 1 day, then 1 tab for 1 day. 21 tablet Ronel Rodeheaver, DO   amoxicillin -clavulanate (AUGMENTIN ) 875-125 MG tablet Take 1 tablet by mouth every 12 (twelve) hours. 14 tablet Aristides Luckey, DO      PDMP not reviewed this encounter.   Emmersen Garraway, DO 10/02/23 1938

## 2023-10-02 NOTE — Discharge Instructions (Addendum)
 Stop by the pharmacy to pick up your prescriptions.  Follow up with your primary care provider or return to the urgent care, if not improving.
